# Patient Record
Sex: Female | Born: 1990 | Race: White | Hispanic: No | Marital: Married | Smoking: Never smoker
Health system: Southern US, Community
[De-identification: ages and names within clinical notes are randomized; demographics above are authoritative.]

## PROBLEM LIST (undated history)

## (undated) ENCOUNTER — Inpatient Hospital Stay (HOSPITAL_COMMUNITY): Payer: Self-pay

---
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
## 2011-02-04 IMAGING — US US OB COMP +14 WK
1 series · 18 of 28 positions shown · non-contrast
Comparison: none

OBSTETRICAL ULTRASOUND:
 This ultrasound was performed in The [HOSPITAL], and the [facility] report will be stored to [REDACTED] PACS.

[Series 1: us ob comp +14 wk · 18 of 82 slices shown]
[im 1/82]
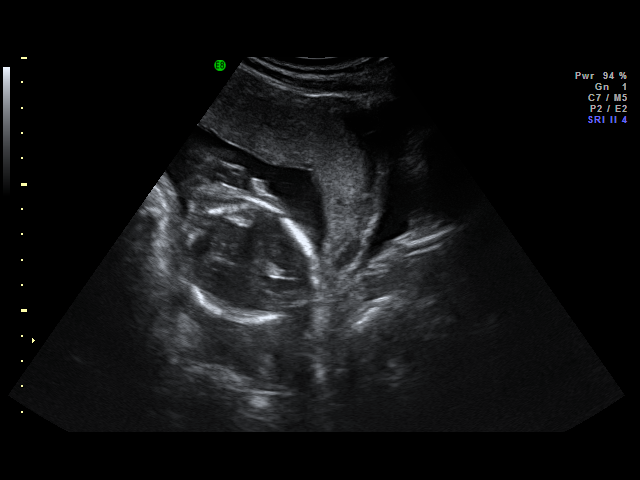
[im 7/82]
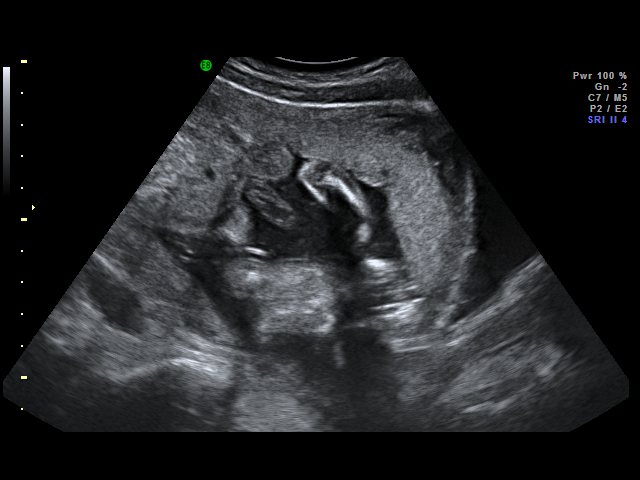
[im 10/82]
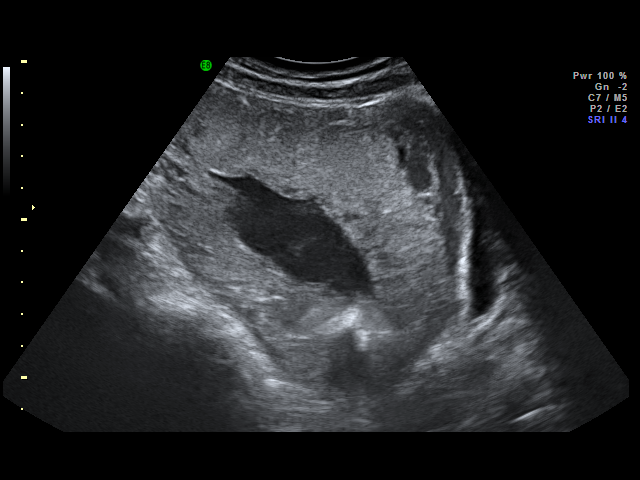
[im 16/82]
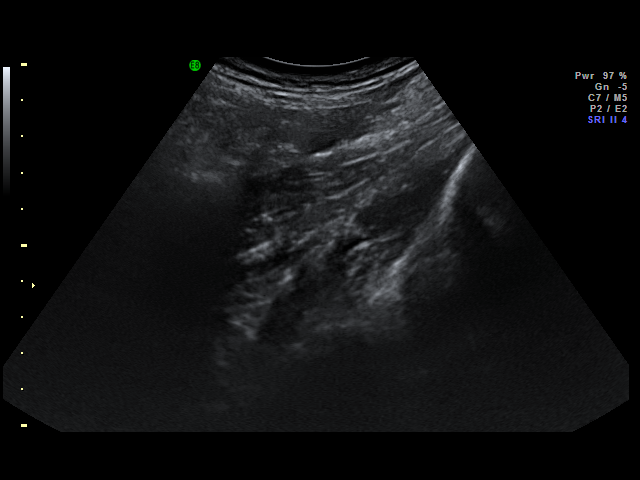
[im 22/82]
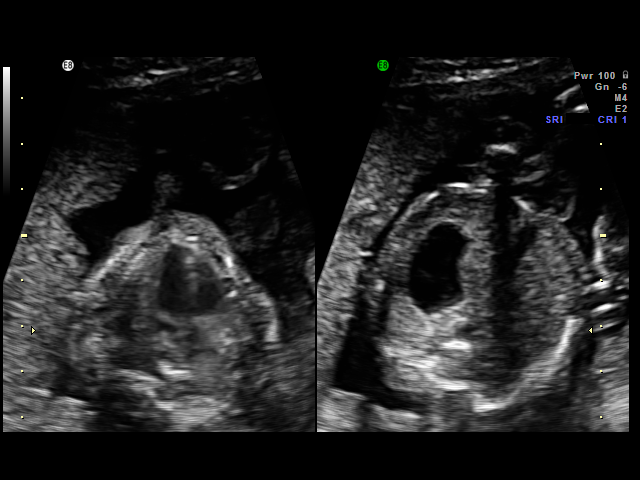
[im 25/82]
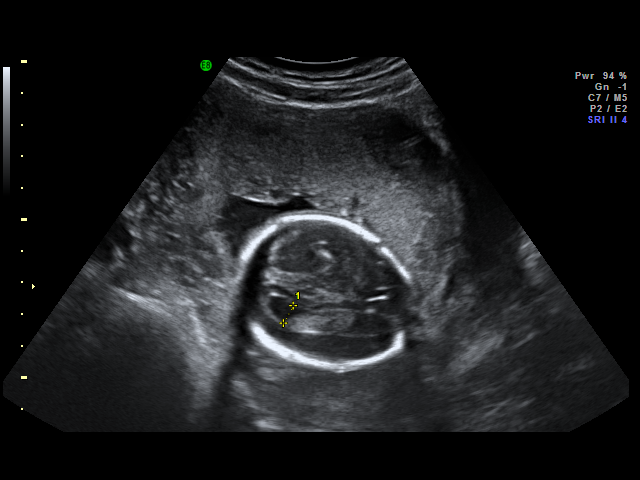
[im 31/82]
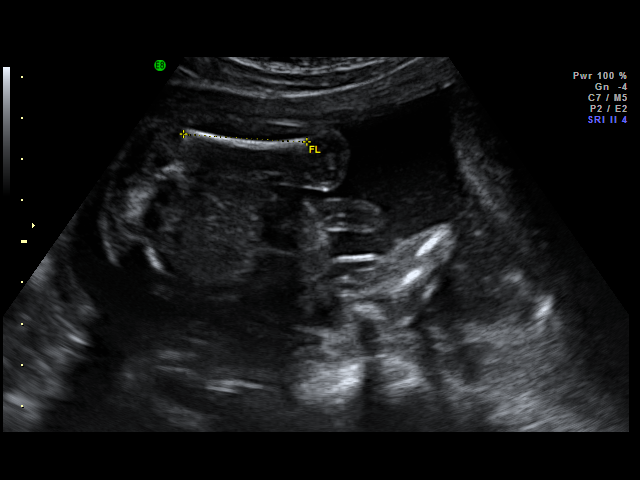
[im 34/82]
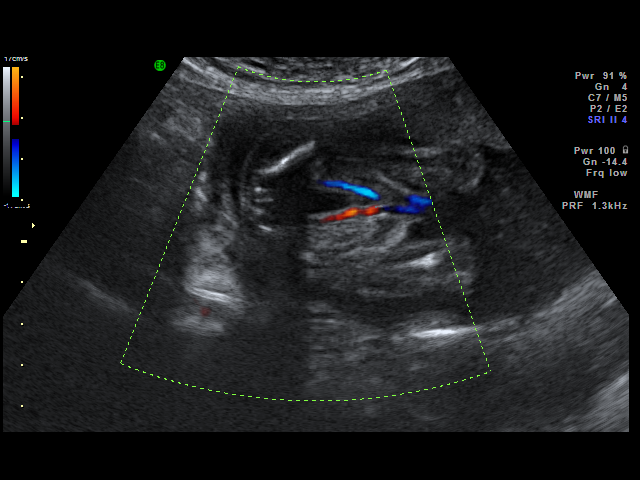
[im 40/82]
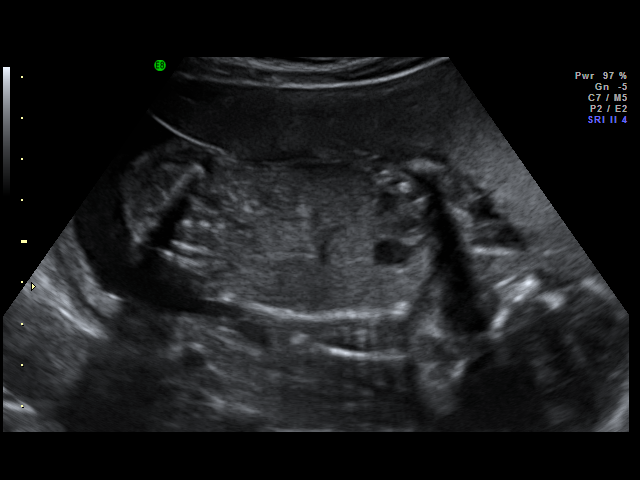
[im 43/82]
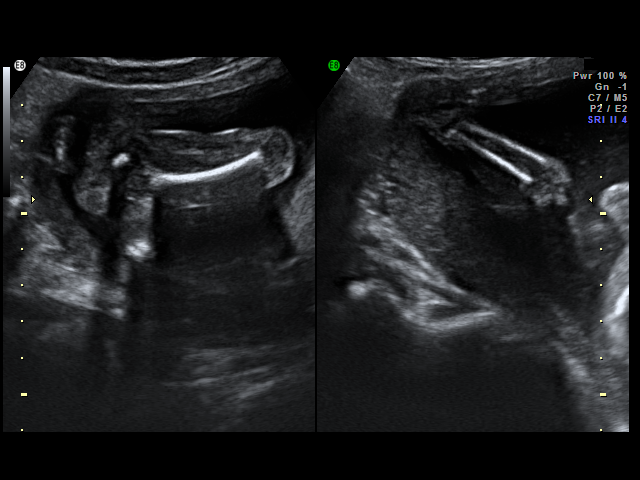
[im 49/82]
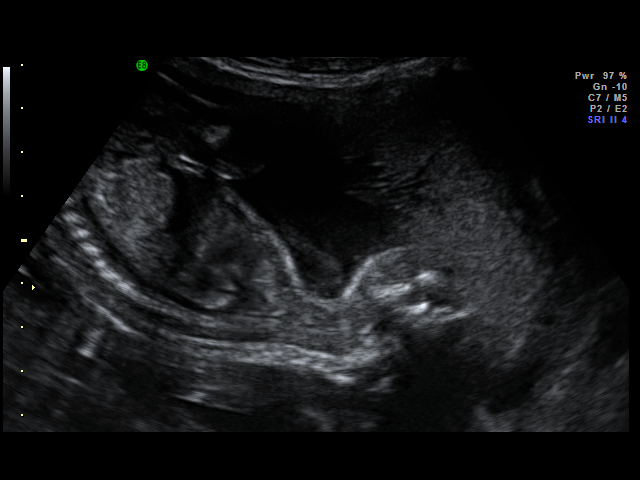
[im 52/82]
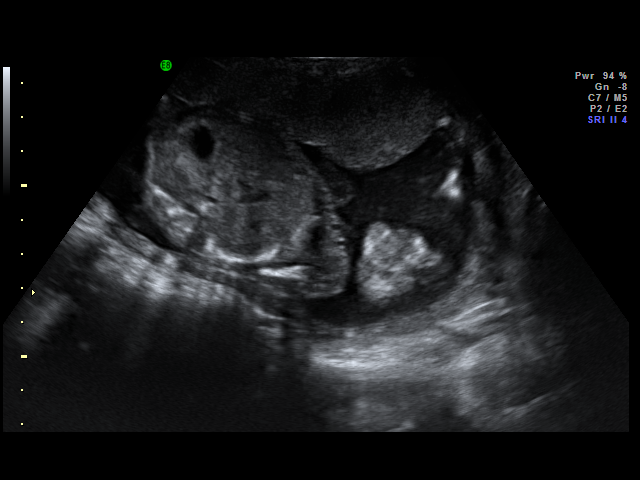
[im 58/82]
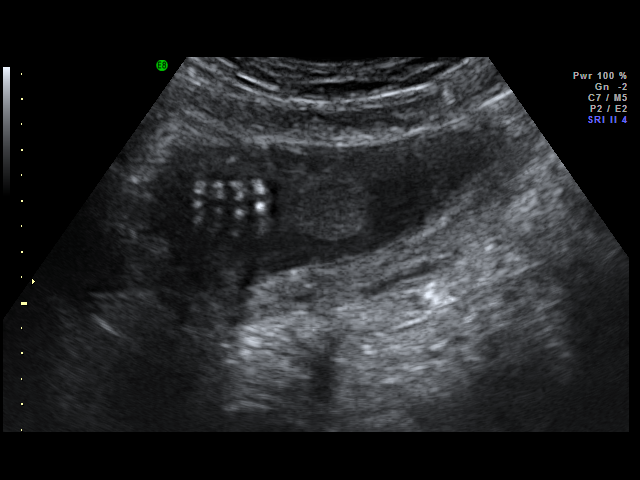
[im 64/82]
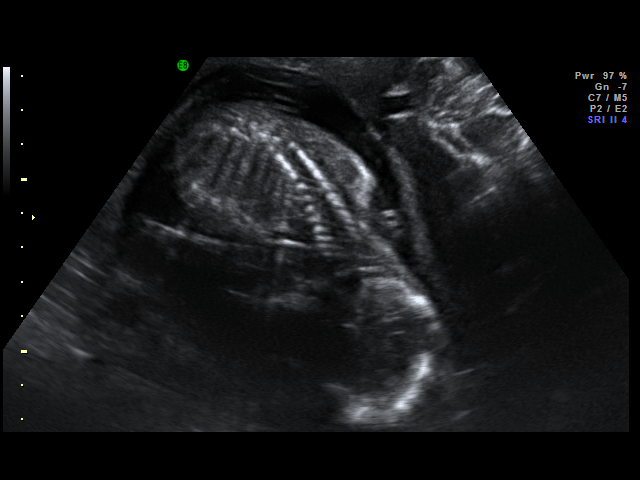
[im 67/82]
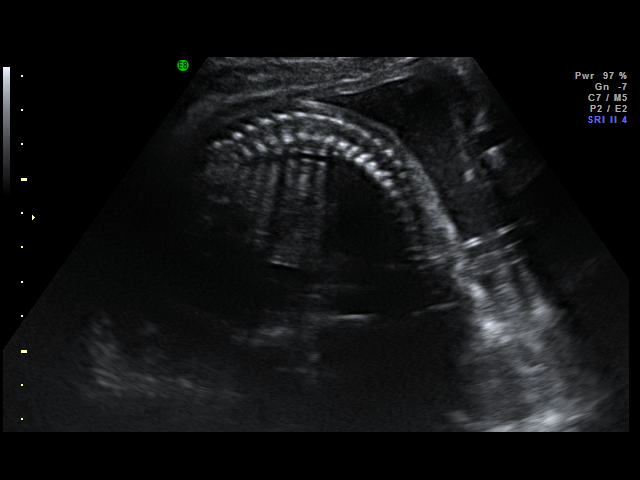
[im 73/82]
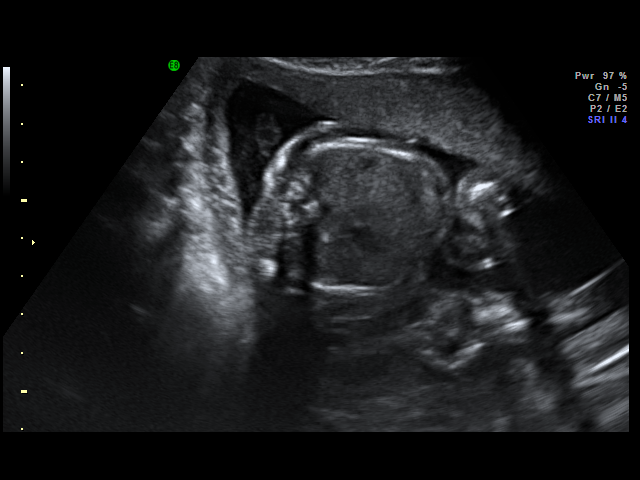
[im 76/82]
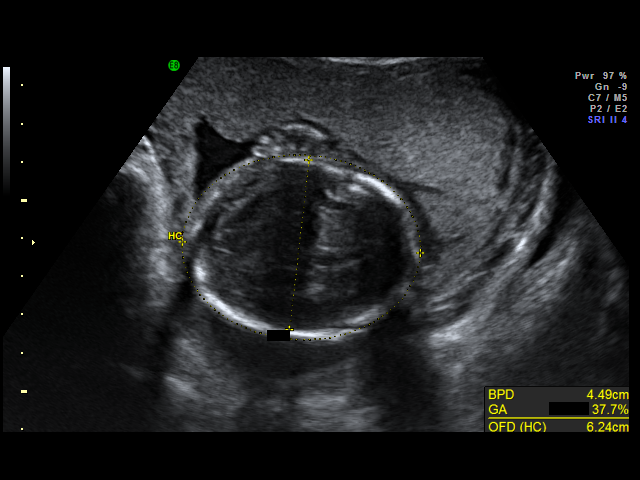
[im 82/82]
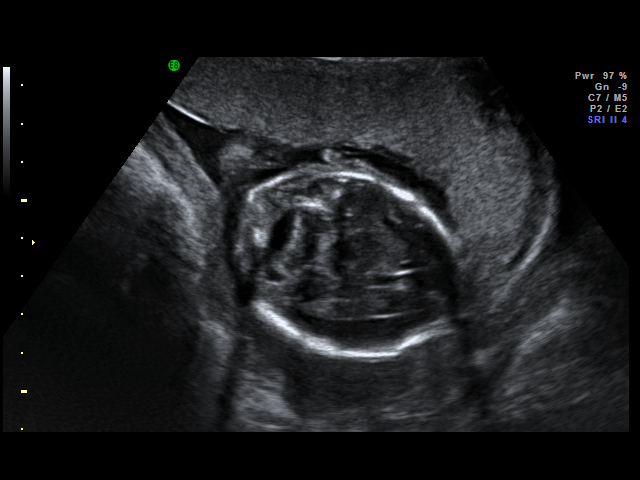

[18 of 28 positions shown; findings below may reference images not displayed]

IMPRESSION: [facility] has also been faxed to the ordering physician.

## 2012-01-14 IMAGING — CT CT HEAD W/O CM
1 of 2 series · 16 of 30 positions shown, 20 images · non-contrast
Comparison: None.

CLINICAL DATA: Syncopal episode striking head on corner of wall.

CT HEAD WITHOUT CONTRAST
TECHNIQUE: Contiguous axial images were obtained from the base of
the skull through the vertex without intravenous contrast.

[Series 3: recon 2: brain · axial · 0.47mm/px · z∈[+135,+262]mm · 16 of 56 slices shown, 20 images]
[im 3/56  brain]
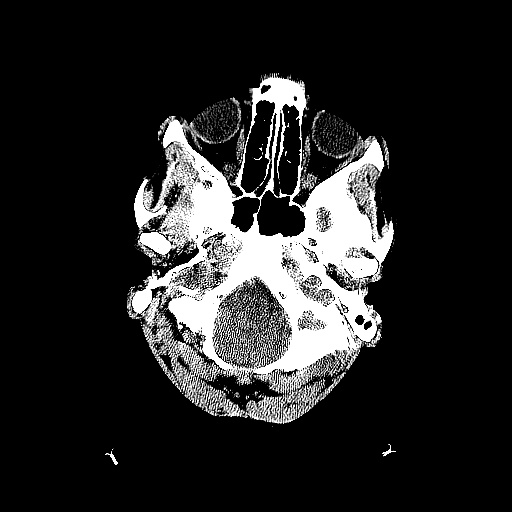
[im 3/56  bone]
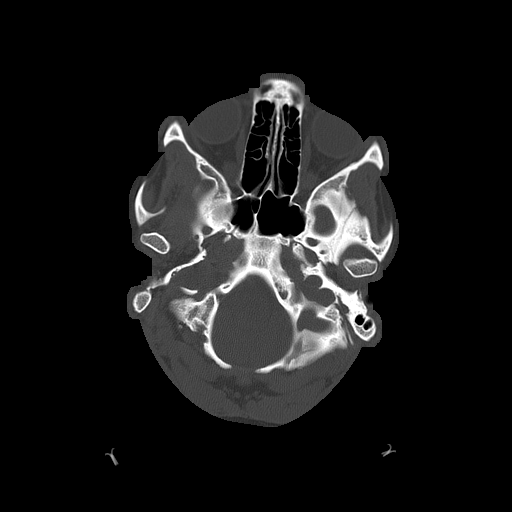
[im 6/56  brain]
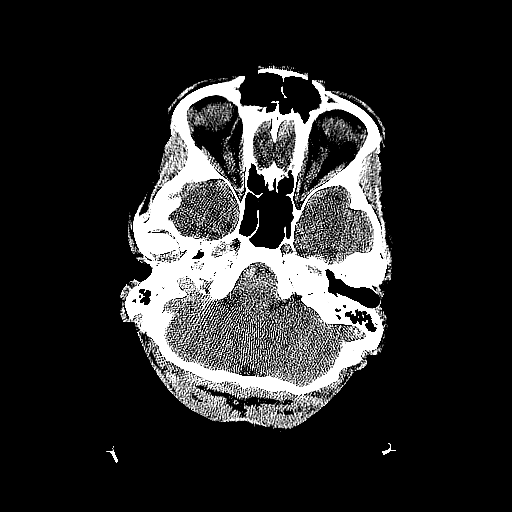
[im 9/56  brain]
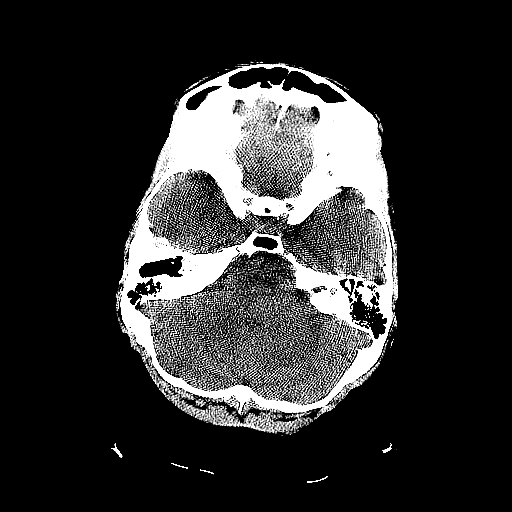
[im 12/56  brain]
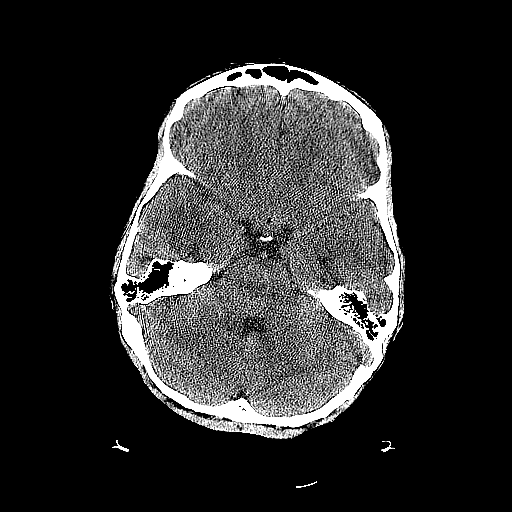
[im 18/56  brain]
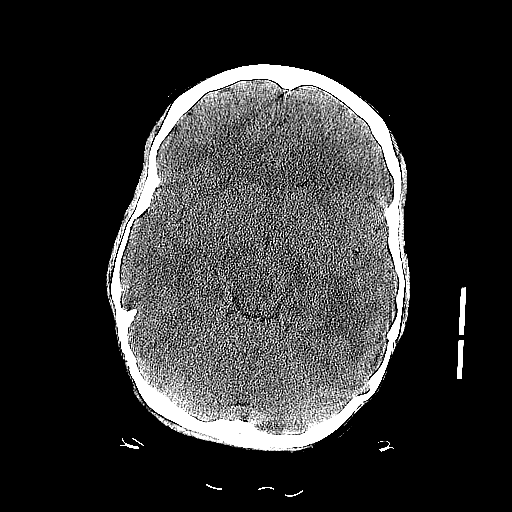
[im 18/56  bone]
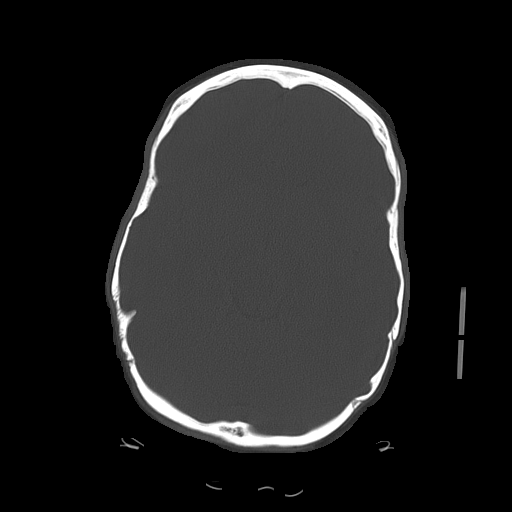
[im 21/56  brain]
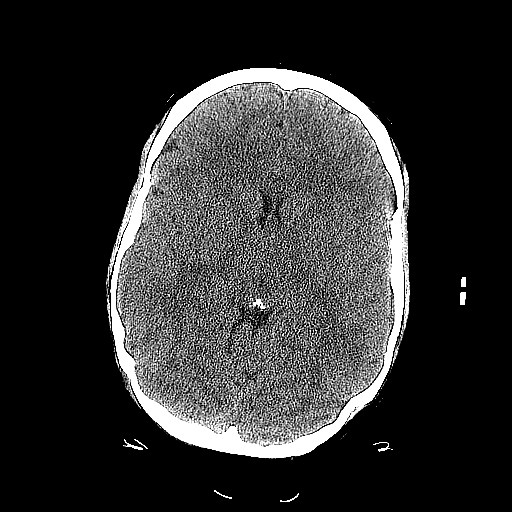
[im 24/56  brain]
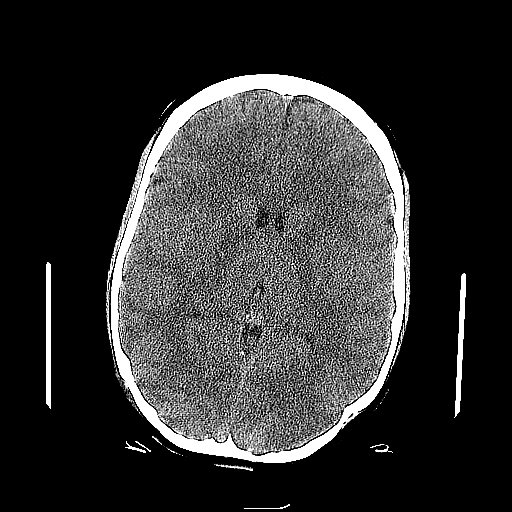
[im 27/56  brain]
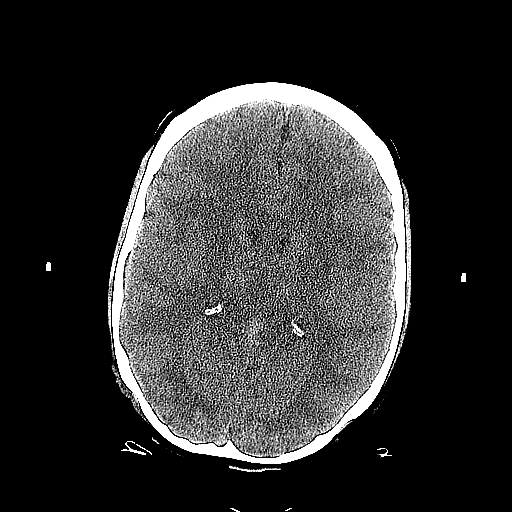
[im 29/56  brain]
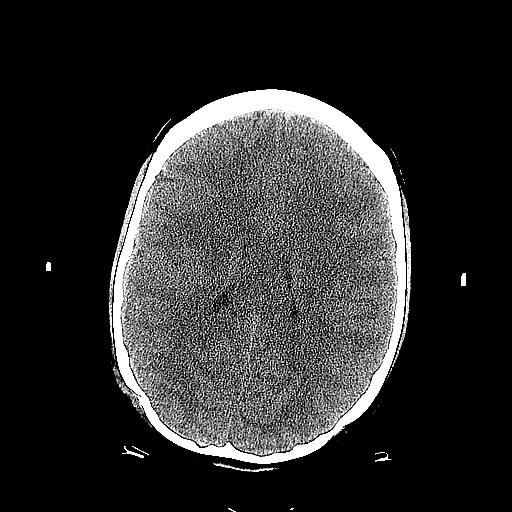
[im 29/56  bone]
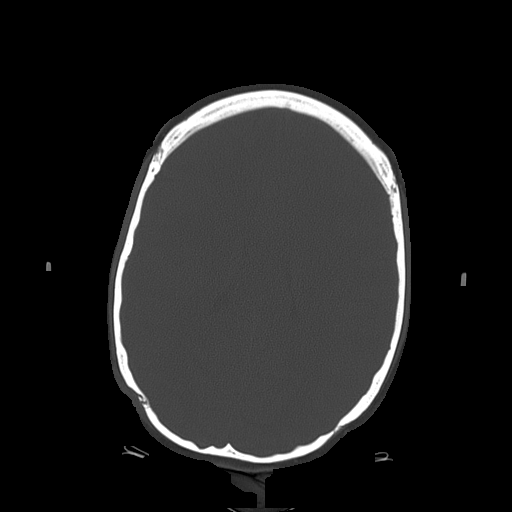
[im 32/56  brain]
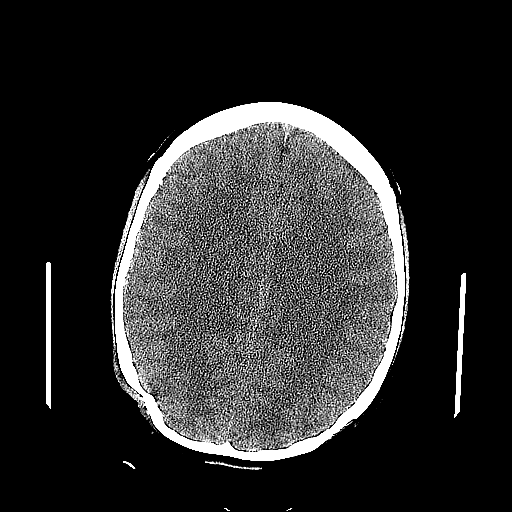
[im 35/56  brain]
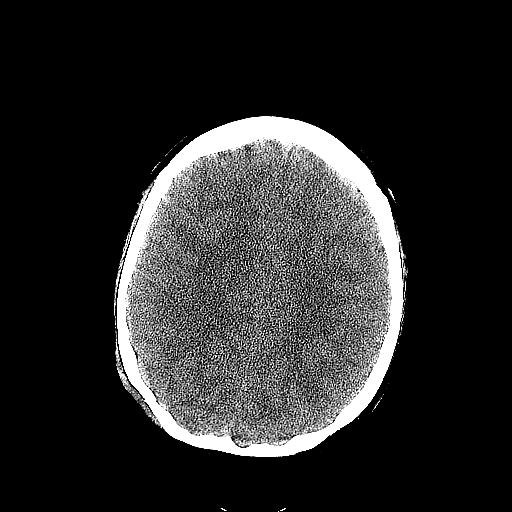
[im 38/56  brain]
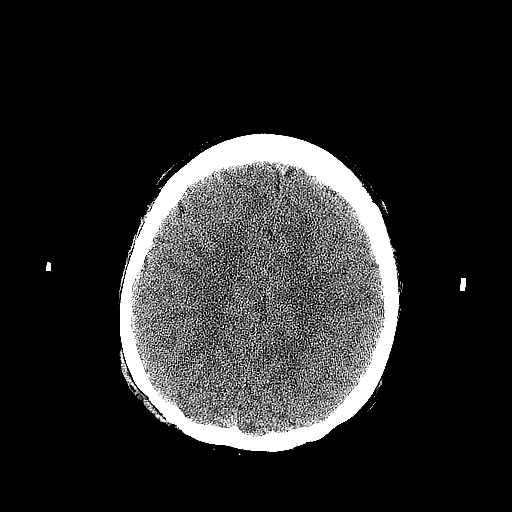
[im 44/56  brain]
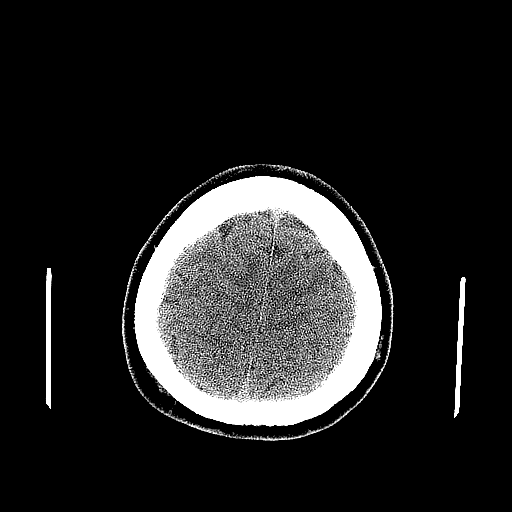
[im 44/56  bone]
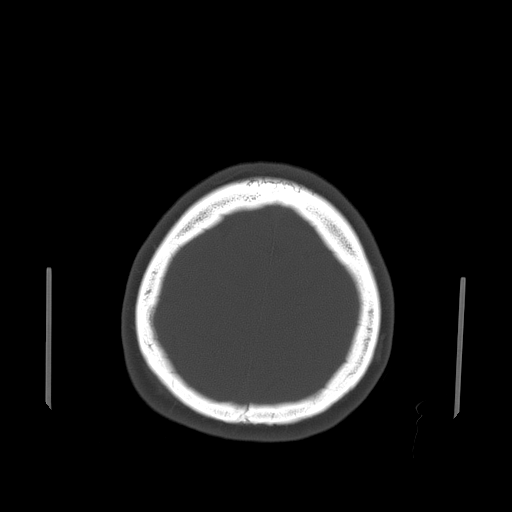
[im 47/56  brain]
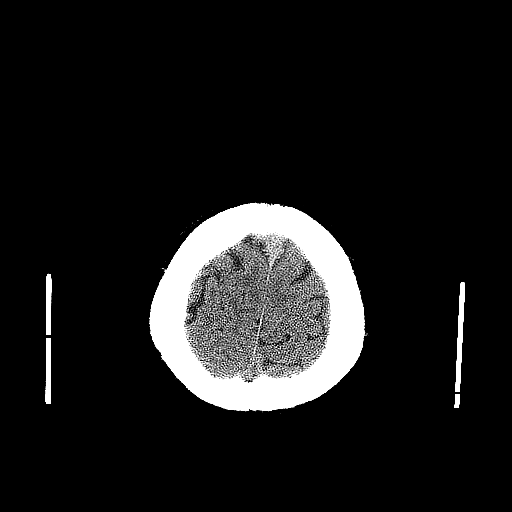
[im 50/56  brain]
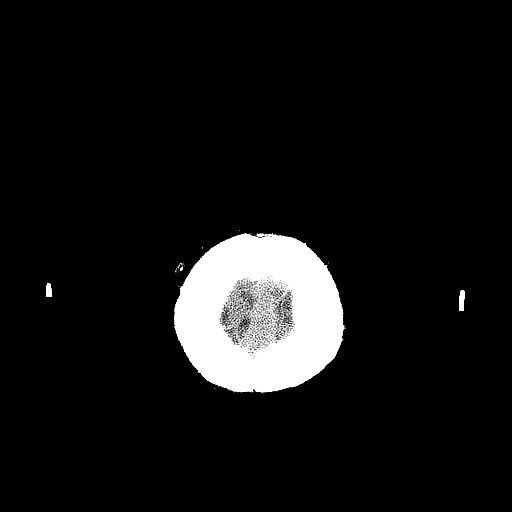
[im 53/56  brain]
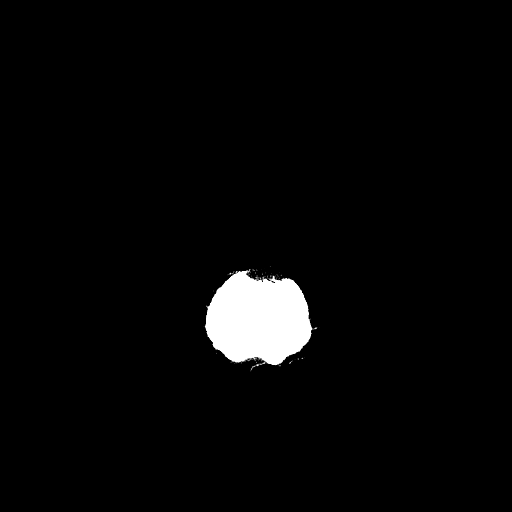

[16 of 30 positions shown; findings below may reference images not displayed]

FINDINGS: There is no evidence for acute infarction, intracranial
hemorrhage, mass lesion, hydrocephalus, or extra-axial fluid. There
is no atrophy or white matter disease.  The calvarium is intact.
There is a small right parieto-occipital scalp hematoma.  The
cerebellar tonsils are incompletely evaluated but present at the
level of the foramen magnum.
IMPRESSION: Small right parieto-occipital scalp hematoma.  No skull fracture or
intracranial hemorrhage.

## 2012-07-08 ENCOUNTER — Encounter (HOSPITAL_COMMUNITY): Payer: Self-pay | Admitting: *Deleted

## 2012-07-08 ENCOUNTER — Inpatient Hospital Stay (HOSPITAL_COMMUNITY)
Admission: AD | Admit: 2012-07-08 | Discharge: 2012-07-08 | Disposition: A | Discharge status: Home / Self Care | Payer: 59 | Source: Ambulatory Visit | Attending: Obstetrics and Gynecology | Admitting: Obstetrics and Gynecology

## 2012-07-08 DIAGNOSIS — N390 Urinary tract infection, site not specified: Secondary | ICD-10-CM | POA: Insufficient documentation

## 2012-07-08 DIAGNOSIS — R350 Frequency of micturition: Secondary | ICD-10-CM | POA: Insufficient documentation

## 2012-07-08 DIAGNOSIS — T8389XA Other specified complication of genitourinary prosthetic devices, implants and grafts, initial encounter: Secondary | ICD-10-CM | POA: Insufficient documentation

## 2012-07-08 LAB — URINALYSIS, ROUTINE W REFLEX MICROSCOPIC
Glucose, UA: NEGATIVE mg/dL
Ketones, ur: NEGATIVE mg/dL
Protein, ur: >300 mg/dL — AB
Urobilinogen, UA: 0.2 mg/dL (ref 0.0–1.0)

## 2012-07-08 LAB — URINE MICROSCOPIC-ADD ON

## 2012-07-08 MED ORDER — CIPROFLOXACIN HCL 500 MG PO TABS: 500.0000 mg | ORAL_TABLET | Freq: Two times a day (BID) | ORAL | Status: DC

## 2012-07-08 MED ORDER — PHENAZOPYRIDINE HCL 200 MG PO TABS: 200.0000 mg | ORAL_TABLET | Freq: Three times a day (TID) | ORAL | Status: DC | PRN

## 2012-07-08 MED ORDER — CIPROFLOXACIN HCL 500 MG PO TABS
500.0000 mg | ORAL_TABLET | Freq: Once | ORAL | Status: AC
  Administered 2012-07-08: 500 mg via ORAL
  Filled 2012-07-08: qty 1

## 2012-07-08 MED ORDER — PHENAZOPYRIDINE HCL 100 MG PO TABS
200.0000 mg | ORAL_TABLET | Freq: Once | ORAL | Status: AC
  Administered 2012-07-08: 200 mg via ORAL
  Filled 2012-07-08: qty 2

## 2012-07-08 NOTE — MAU Note (Signed)
Pt reports urinary frequency and urgency tonight.  Concerned that she is having problems with IUD.

## 2012-07-08 NOTE — MAU Provider Note (Signed)
Chief Complaint: Urinary Frequency  First Provider Initiated Contact with Patient 07/08/12 0200     SUBJECTIVE HPI: Mackenzie Walters is a 22 y.o. G28P2002 female who presents with urinary frequency, urgency and leakage since this evening tonight and concerned that she may have problems with her IUD.    Past Medical History  Diagnosis Date  . Asthma     as child   OB History   Grav Para Term Preterm Abortions TAB SAB Ect Mult Living   2 2 2       2      # Outc Date GA Lbr Len/2nd Wgt Sex Del Anes PTL Lv   1 TRM 2010 [redacted]w[redacted]d  3.6kg(7lb15oz) M SVD EPI  Yes   2 TRM 8/12 [redacted]w[redacted]d 00:00 / 00:47 3.39kg(7lb7.6oz) F SVD EPI  Yes   Comments: none     Past Surgical History  Procedure Laterality Date  . Mouth surgery  2009   History   Social History  . Marital Status: Single    Spouse Name: N/A    Number of Children: N/A  . Years of Education: N/A   Occupational History  . Not on file.   Social History Main Topics  . Smoking status: Never Smoker   . Smokeless tobacco: Not on file  . Alcohol Use: No  . Drug Use: No  . Sexually Active: Yes    Birth Control/ Protection: IUD     Comment: desires tubal   Other Topics Concern  . Not on file   Social History Narrative  . No narrative on file   No current facility-administered medications on file prior to encounter.   Current Outpatient Prescriptions on File Prior to Encounter  Medication Sig Dispense Refill  . prenatal vitamin w/FE, FA (PRENATAL 1 + 1) 27-1 MG TABS Take 1 tablet by mouth daily.         Allergies  Allergen Reactions  . Penicillins Other (See Comments)    Happened in childhood,reaction unknown    ROS: Positive for urinary frequency, urgency, leaking. Negative for fever, chills cough abdominal pain, dyspareunia, vaginal bleeding, vaginal discharge.  OBJECTIVE Blood pressure 98/54, pulse 91, temperature 97.5 F (36.4 C), temperature source Oral, resp. rate 16, height 5\' 6"  (1.676 m), weight 59.603 kg (131 lb 6.4  oz). GENERAL: Well-developed, well-nourished female in no acute distress.  HEENT: Normocephalic HEART: normal rate RESP: normal effort ABDOMEN: Soft, mild suprapubic tenderness. BACK: No CVA tenderness. EXTREMITIES: Nontender, no edema NEURO: Alert and oriented SPECULUM EXAM: Refused  LAB RESULTS Results for orders placed during the hospital encounter of 07/08/12 (from the past 24 hour(s))  URINALYSIS, ROUTINE W REFLEX MICROSCOPIC     Status: Abnormal   Collection Time    07/08/12 12:56 AM      Result Value Range   Color, Urine YELLOW  YELLOW   APPearance CLOUDY (*) CLEAR   Specific Gravity, Urine 1.025  1.005 - 1.030   pH 7.5  5.0 - 8.0   Glucose, UA NEGATIVE  NEGATIVE mg/dL   Hgb urine dipstick LARGE (*) NEGATIVE   Bilirubin Urine NEGATIVE  NEGATIVE   Ketones, ur NEGATIVE  NEGATIVE mg/dL   Protein, ur >045 (*) NEGATIVE mg/dL   Urobilinogen, UA 0.2  0.0 - 1.0 mg/dL   Nitrite NEGATIVE  NEGATIVE   Leukocytes, UA MODERATE (*) NEGATIVE  URINE MICROSCOPIC-ADD ON     Status: Abnormal   Collection Time    07/08/12 12:56 AM      Result Value Range  Squamous Epithelial / LPF FEW (*) RARE   WBC, UA TOO NUMEROUS TO COUNT  <3 WBC/hpf   RBC / HPF TOO NUMEROUS TO COUNT  <3 RBC/hpf   Bacteria, UA MANY (*) RARE  POCT PREGNANCY, URINE     Status: None   Collection Time    07/08/12  1:07 AM      Result Value Range   Preg Test, Ur NEGATIVE  NEGATIVE    IMAGING No results found.  MAU COURSE  ASSESSMENT 1. UTI (lower urinary tract infection)   Low suspicion for IUD complication based on patient's subjective exam, but full assessment limited by refusal of pelvic exam. Dr. Arelia Sneddon notified. No new orders.  PLAN Discharge home in stable condition. Urine culture pending. Increase fluids. Discussed signs of IUD complication including worsening cramping after treatment for UTI, fever greater than 100.4, vaginal bleeding accompanied by cramping or pain with intercourse.     Follow-up  Information   Follow up with [facility] [place], [place].. (As needed if no improvement in 2-3 days.)    Contact information:   [address] [place] [address] [phone]       Medication List    TAKE these medications       ciprofloxacin 500 MG tablet  Commonly known as:  CIPRO  Take 1 tablet (500 mg total) by mouth 2 (two) times daily.     phenazopyridine 200 MG tablet  Commonly known as:  PYRIDIUM  Take 1 tablet (200 mg total) by mouth 3 (three) times daily as needed for pain.     prenatal vitamin w/FE, FA 27-1 MG Tabs  Take 1 tablet by mouth daily.       [place], CNM 07/08/2012  2:48 AM

## 2012-07-09 LAB — URINE CULTURE: Colony Count: 70000

## 2013-12-07 ENCOUNTER — Encounter (HOSPITAL_COMMUNITY): Payer: Self-pay | Admitting: *Deleted

## 2013-12-31 ENCOUNTER — Encounter (HOSPITAL_COMMUNITY): Payer: Self-pay

## 2013-12-31 ENCOUNTER — Inpatient Hospital Stay (HOSPITAL_COMMUNITY)
Admission: AD | Admit: 2013-12-31 | Discharge: 2013-12-31 | Disposition: A | Discharge status: Home / Self Care | Source: Ambulatory Visit | Attending: Obstetrics and Gynecology | Admitting: Obstetrics and Gynecology

## 2013-12-31 DIAGNOSIS — Z3A22 22 weeks gestation of pregnancy: Secondary | ICD-10-CM | POA: Insufficient documentation

## 2013-12-31 DIAGNOSIS — O9989 Other specified diseases and conditions complicating pregnancy, childbirth and the puerperium: Secondary | ICD-10-CM | POA: Diagnosis present

## 2013-12-31 DIAGNOSIS — R05 Cough: Secondary | ICD-10-CM | POA: Diagnosis not present

## 2013-12-31 NOTE — Discharge Instructions (Signed)

## 2013-12-31 NOTE — MAU Note (Signed)
Has had a cough x 1 week; states leaks clear fluid every time she coughs. Denies vaginal bleeding. Positive fetal movement.

## 2014-11-05 ENCOUNTER — Encounter (HOSPITAL_COMMUNITY): Payer: Self-pay | Admitting: *Deleted

## 2016-11-24 ENCOUNTER — Inpatient Hospital Stay (HOSPITAL_COMMUNITY)
Admission: AD | Admit: 2016-11-24 | Discharge: 2016-11-24 | Disposition: A | Discharge status: Home / Self Care | Payer: Medicaid Other | Source: Ambulatory Visit | Attending: Obstetrics and Gynecology | Admitting: Obstetrics and Gynecology

## 2016-11-24 ENCOUNTER — Encounter (HOSPITAL_COMMUNITY): Payer: Self-pay | Admitting: *Deleted

## 2016-11-24 DIAGNOSIS — O479 False labor, unspecified: Secondary | ICD-10-CM

## 2016-11-24 DIAGNOSIS — O368130 Decreased fetal movements, third trimester, not applicable or unspecified: Secondary | ICD-10-CM | POA: Diagnosis not present

## 2016-11-24 DIAGNOSIS — O471 False labor at or after 37 completed weeks of gestation: Secondary | ICD-10-CM | POA: Diagnosis not present

## 2016-11-24 DIAGNOSIS — R110 Nausea: Secondary | ICD-10-CM | POA: Diagnosis not present

## 2016-11-24 DIAGNOSIS — Z3A38 38 weeks gestation of pregnancy: Secondary | ICD-10-CM

## 2016-11-24 DIAGNOSIS — O26899 Other specified pregnancy related conditions, unspecified trimester: Secondary | ICD-10-CM

## 2016-11-24 MED ORDER — ONDANSETRON HCL 8 MG PO TABS: 8.0000 mg | ORAL_TABLET | Freq: Three times a day (TID) | ORAL | 0 refills | Status: DC | PRN

## 2016-11-24 MED ORDER — ONDANSETRON HCL 4 MG PO TABS
8.0000 mg | ORAL_TABLET | Freq: Once | ORAL | Status: AC
  Administered 2016-11-24: 8 mg via ORAL
  Filled 2016-11-24: qty 2

## 2016-11-24 MED ORDER — CALCIUM CARBONATE ANTACID 500 MG PO CHEW
2.0000 | CHEWABLE_TABLET | Freq: Once | ORAL | Status: AC
  Administered 2016-11-24: 400 mg via ORAL
  Filled 2016-11-24: qty 2

## 2016-11-24 NOTE — MAU Note (Signed)
Cramping and nausea since this am. Gradually getting worse. Denies LOF or bleeding

## 2016-11-24 NOTE — MAU Note (Signed)
Urine sent to lab 

## 2016-11-24 NOTE — MAU Provider Note (Signed)
Chief Complaint  Patient presents with  . Nausea  . Abdominal Cramping     First Provider Initiated Contact with Patient 11/24/16 2306      S: Mackenzie Walters  is a 26 y.o. y.o. year old G47P2003 female at [redacted]w[redacted]d weeks gestation who presents to MAU reporting cramping, nausea, heartburn and decreased fetal movement. Gets prenatal care in [place]. Is in [place] visiting family.   Contractions: Irreg, mild Vaginal bleeding: denies Leaking of fluid: denies Reports loosing mucus plug.   Patient Active Problem List   Diagnosis Date Noted  . NEOPLASM, SKIN, UNCERTAIN BEHAVIOR 59/16/3846    O:  Patient Vitals for the past 24 hrs:  BP Temp Pulse Resp Height Weight  11/24/16 2210 134/75 - (!) 109 - - -  11/24/16 2154 133/83 98.4 F (36.9 C) (!) 110 18 5\' 7"  (1.702 m) 183 lb (83 kg)   General: NAD Heart: Regular rate Lungs: Normal rate and effort Abd: Soft, NT, Gravid, S=D Pelvic: NEFG, no blood.  Dilation: 2.5 Effacement (%): 60 Station: -3 Exam by:: Glenice Bow, rnc   NST performed EFM: 130, reactive Toco: UI  Decreased fetal movement resolved with reactive NST. Contractions C/W BH, not active labor.   A: [redacted]w[redacted]d week IUP 1. Decreased fetal movements in third trimester, single or unspecified fetus   2. False labor   3. Pregnancy related nausea, antepartum      P: Discharge home in stable condition. Labor precautions and fetal kick counts. Rx Zofran. Follow-up as scheduled for prenatal visit or sooner as needed if symptoms worsen. Return to maternity admissions as needed if symptoms worsen.  Tamala Julian, [place], [place] 11/24/2016 11:31 PM  2

## 2016-11-24 NOTE — Discharge Instructions (Signed)
Braxton Hicks Contractions °Contractions of the uterus can occur throughout pregnancy, but they are not always a sign that you are in labor. You may have practice contractions called Braxton Hicks contractions. These false labor contractions are sometimes confused with true labor. °What are Braxton Hicks contractions? °Braxton Hicks contractions are tightening movements that occur in the muscles of the uterus before labor. Unlike true labor contractions, these contractions do not result in opening (dilation) and thinning of the cervix. Toward the end of pregnancy (32-34 weeks), Braxton Hicks contractions can happen more often and may become stronger. These contractions are sometimes difficult to tell apart from true labor because they can be very uncomfortable. You should not feel embarrassed if you go to the hospital with false labor. °Sometimes, the only way to tell if you are in true labor is for your health care provider to look for changes in the cervix. The health care provider will do a physical exam and may monitor your contractions. If you are not in true labor, the exam should show that your cervix is not dilating and your water has not broken. °If there are no prenatal problems or other health problems associated with your pregnancy, it is completely safe for you to be sent home with false labor. You may continue to have Braxton Hicks contractions until you go into true labor. °How can I tell the difference between true labor and false labor? °· Differences °? False labor °? Contractions last 30-70 seconds.: Contractions are usually shorter and not as strong as true labor contractions. °? Contractions become very regular.: Contractions are usually irregular. °? Discomfort is usually felt in the top of the uterus, and it spreads to the lower abdomen and low back.: Contractions are often felt in the front of the lower abdomen and in the groin. °? Contractions do not go away with walking.: Contractions may  go away when you walk around or change positions while lying down. °? Contractions usually become more intense and increase in frequency.: Contractions get weaker and are shorter-lasting as time goes on. °? The cervix dilates and gets thinner.: The cervix usually does not dilate or become thin. °Follow these instructions at home: °· Take over-the-counter and prescription medicines only as told by your health care provider. °· Keep up with your usual exercises and follow other instructions from your health care provider. °· Eat and drink lightly if you think you are going into labor. °· If Braxton Hicks contractions are making you uncomfortable: °? Change your position from lying down or resting to walking, or change from walking to resting. °? Sit and rest in a tub of warm water. °? Drink enough fluid to keep your urine clear or pale yellow. Dehydration may cause these contractions. °? Do slow and deep breathing several times an hour. °· Keep all follow-up prenatal visits as told by your health care provider. This is important. °Contact a health care provider if: °· You have a fever. °· You have continuous pain in your abdomen. °Get help right away if: °· Your contractions become stronger, more regular, and closer together. °· You have fluid leaking or gushing from your vagina. °· You pass blood-tinged mucus (bloody show). °· You have bleeding from your vagina. °· You have low back pain that you never had before. °· You feel your baby’s head pushing down and causing pelvic pressure. °· Your baby is not moving inside you as much as it used to. °Summary °· Contractions that occur before labor are   called Braxton Hicks contractions, false labor, or practice contractions.  Braxton Hicks contractions are usually shorter, weaker, farther apart, and less regular than true labor contractions. True labor contractions usually become progressively stronger and regular and they become more frequent.  Manage discomfort from  [facility] contractions by changing position, resting in a warm bath, drinking plenty of water, or practicing deep breathing. This information is not intended to replace advice given to you by your health care provider. Make sure you discuss any questions you have with your health care provider. Document Released: 01/22/2005 Document Revised: 12/12/2015 Document Reviewed: 12/12/2015 Elsevier Interactive Patient Education  2017 [place].   Fetal Movement Counts Patient Name: ________________________________________________ Patient Due Date: ____________________ What is a fetal movement count? A fetal movement count is the number of times that you feel your baby move during a certain amount of time. This may also be called a fetal kick count. A fetal movement count is recommended for every pregnant woman. You may be asked to start counting fetal movements as early as week 28 of your pregnancy. Pay attention to when your baby is most active. You may notice your baby's sleep and wake cycles. You may also notice things that make your baby move more. You should do a fetal movement count:  When your baby is normally most active.  At the same time each day.  A good time to count movements is while you are resting, after having something to eat and drink. How do I count fetal movements? 1. Find a quiet, comfortable area. Sit, or lie down on your side. 2. Write down the date, the start time and stop time, and the number of movements that you felt between those two times. Take this information with you to your health care visits. 3. For 2 hours, count kicks, flutters, swishes, rolls, and jabs. You should feel at least 10 movements during 2 hours. 4. You may stop counting after you have felt 10 movements. 5. If you do not feel 10 movements in 2 hours, have something to eat and drink. Then, keep resting and counting for 1 hour. If you feel at least 4 movements during that hour, you may stop  counting. Contact a health care provider if:  You feel fewer than 4 movements in 2 hours.  Your baby is not moving like he or she usually does. Date: ____________ Start time: ____________ Stop time: ____________ Movements: ____________ Date: ____________ Start time: ____________ Stop time: ____________ Movements: ____________ Date: ____________ Start time: ____________ Stop time: ____________ Movements: ____________ Date: ____________ Start time: ____________ Stop time: ____________ Movements: ____________ Date: ____________ Start time: ____________ Stop time: ____________ Movements: ____________ Date: ____________ Start time: ____________ Stop time: ____________ Movements: ____________ Date: ____________ Start time: ____________ Stop time: ____________ Movements: ____________ Date: ____________ Start time: ____________ Stop time: ____________ Movements: ____________ Date: ____________ Start time: ____________ Stop time: ____________ Movements: ____________ This information is not intended to replace advice given to you by your health care provider. Make sure you discuss any questions you have with your health care provider. Document Released: 02/21/2006 Document Revised: 09/21/2015 Document Reviewed: 03/03/2015 Elsevier Interactive Patient Education  [employer].

## 2016-11-24 NOTE — Progress Notes (Signed)
Pt also c/o of indigestion and "baby not moving as much"

## 2017-02-27 ENCOUNTER — Encounter (HOSPITAL_COMMUNITY): Payer: Self-pay

## 2020-12-18 ENCOUNTER — Other Ambulatory Visit: Payer: Self-pay

## 2020-12-18 ENCOUNTER — Emergency Department
Admission: EM | Admit: 2020-12-18 | Discharge: 2020-12-18 | Disposition: A | Discharge status: Home / Self Care | Payer: Managed Care, Other (non HMO) | Attending: Emergency Medicine | Admitting: Emergency Medicine

## 2020-12-18 DIAGNOSIS — W231XXA Caught, crushed, jammed, or pinched between stationary objects, initial encounter: Secondary | ICD-10-CM | POA: Insufficient documentation

## 2020-12-18 DIAGNOSIS — J45909 Unspecified asthma, uncomplicated: Secondary | ICD-10-CM | POA: Diagnosis not present

## 2020-12-18 DIAGNOSIS — S6991XA Unspecified injury of right wrist, hand and finger(s), initial encounter: Secondary | ICD-10-CM | POA: Insufficient documentation

## 2020-12-18 MED ORDER — CEPHALEXIN 500 MG PO CAPS: 1000.0000 mg | ORAL_CAPSULE | Freq: Two times a day (BID) | ORAL | 0 refills | Status: DC

## 2020-12-18 MED ORDER — LIDOCAINE HCL (PF) 1 % IJ SOLN
10.0000 mL | Freq: Once | INTRAMUSCULAR | Status: AC
  Administered 2020-12-18: 10 mL
  Filled 2020-12-18: qty 10

## 2020-12-18 NOTE — ED Provider Notes (Signed)
[facility] Emergency Department Provider Note  ____________________________________________  Time seen: Approximately 5:58 PM  I have reviewed the triage vital signs and the nursing notes.   HISTORY  Chief Complaint Nail Problem    HPI Mackenzie Walters is a 30 y.o. female who presents the emergency department after sustaining an injury to the right pinky fingernail.  Patient caught it while laying some flooring.  It is loose and cannot at this time.  No active bleeding.  No other injury or complaint.       Past Medical History:  Diagnosis Date   Asthma    as child    Patient Active Problem List   Diagnosis Date Noted   NEOPLASM, SKIN, UNCERTAIN BEHAVIOR 31/49/7026    Past Surgical History:  Procedure Laterality Date   MOUTH SURGERY  2009    Prior to Admission medications   Medication Sig Start Date End Date Taking? Authorizing Provider  cephALEXin (KEFLEX) 500 MG capsule Take 2 capsules (1,000 mg total) by mouth 2 (two) times daily. 12/18/20  Yes Zavier Canela, Charline Bills, PA-C  diphenhydrAMINE (BENADRYL) 25 MG tablet Take 25 mg by mouth every 6 (six) hours as needed.    [provider]  ondansetron (ZOFRAN) 8 MG tablet Take 1 tablet (8 mg total) by mouth every 8 (eight) hours as needed for nausea or vomiting. 11/24/16   Tamala Julian, [place], [place]  prenatal vitamin w/FE, FA (PRENATAL 1 + 1) 27-1 MG TABS Take 1 tablet by mouth daily.      [provider]  Pseudoeph-Bromphen-DM (ROBITUSSIN ALLERGY/COUGH PO) Take by mouth.    [provider]    Allergies Penicillins  Family History  Problem Relation Age of Onset   Cancer Maternal Grandmother    Heart disease Paternal Grandmother    Heart disease Paternal Grandfather     Social History Social History   Tobacco Use   Smoking status: Never   Smokeless tobacco: Never  Substance Use Topics   Alcohol use: No   Drug use: No     Review of Systems  Constitutional: No  fever/chills Eyes: No visual changes. No discharge ENT: No upper respiratory complaints. Cardiovascular: no chest pain. Respiratory: no cough. No SOB. Gastrointestinal: No abdominal pain.  No nausea, no vomiting.  No diarrhea.  No constipation. Musculoskeletal: No injury to the pinky finger right hand Skin: Negative for rash, abrasions, lacerations, ecchymosis. Neurological: Negative for headaches, focal weakness or numbness.  10 System ROS otherwise negative.  ____________________________________________   PHYSICAL EXAM:  VITAL SIGNS: ED Triage Vitals [12/18/20 1606]  Enc Vitals Group     BP 130/73     Pulse Rate 79     Resp 16     Temp 98.5 F (36.9 C)     Temp Source Oral     SpO2 98 %     Weight      Height      Head Circumference      Peak Flow      Pain Score      Pain Loc      Pain Edu?      Excl. in [place]?      Constitutional: Alert and oriented. Well appearing and in no acute distress. Eyes: Conjunctivae are normal. PERRL. EOMI. Head: Atraumatic. ENT:      Ears:       Nose: No congestion/rhinnorhea.      Mouth/Throat: Mucous membranes are moist.  Neck: No stridor.    Cardiovascular: Normal rate, regular  rhythm. Normal S1 and S2.  Good peripheral circulation. Respiratory: Normal respiratory effort without tachypnea or retractions. Lungs CTAB. Good air entry to the bases with no decreased or absent breath sounds. Musculoskeletal: Full range of motion to all extremities. No gross deformities appreciated.  Visualization of the pinky finger right hand reveals mildly displaced fingernail.  This essentially occurs along the medial aspect of the fingernail.  There is no active bleeding.  Proximal nailbed is still intact.  Lateral nailbed is still intact. Neurologic:  Normal speech and language. No gross focal neurologic deficits are appreciated.  Skin:  Skin is warm, dry and intact. No rash noted. Psychiatric: Mood and affect are normal. Speech and behavior are  normal. Patient exhibits appropriate insight and judgement.   ____________________________________________   LABS (all labs ordered are listed, but only abnormal results are displayed)  Labs Reviewed - No data to display ____________________________________________  EKG   ____________________________________________  RADIOLOGY   No results found.  ____________________________________________    PROCEDURES  Procedure(s) performed:    .Nail Removal  Date/Time: 12/18/2020 6:43 PM Performed by: Darletta Moll, PA-C Authorized by: Darletta Moll, PA-C   Consent:    Consent obtained:  Verbal   Consent given by:  Patient   Risks discussed:  Bleeding, infection, pain and permanent nail deformity Universal protocol:    Procedure explained and questions answered to patient or proxy's satisfaction: yes     Immediately prior to procedure, a time out was called: yes     Patient identity confirmed:  Verbally with patient Location:    Hand:  R small finger Pre-procedure details:    Skin preparation:  Chlorhexidine   Preparation: Patient was prepped and draped in the usual sterile fashion   Anesthesia:    Anesthesia method:  Nerve block   Block location:  Pinky finger R hand   Block needle gauge:  27 G   Block anesthetic:  Lidocaine 1% w/o epi   Block technique:  Digital block   Block injection procedure:  Anatomic landmarks identified, introduced needle, negative aspiration for blood and incremental injection   Block outcome:  Anesthesia achieved Nail Removal:    Removed nail replaced and anchored: yes   Nails trimmed:    Number of nails trimmed:  1 Post-procedure details:    Dressing:  Splint   Procedure completion:  Tolerated well, no immediate complications Comments:     Digital block was performed with good success.  Using electrical Bovie, 2 small holes were placed along the medial aspect of the nailbed.  Using 3-0 suture, medial nailbed is tacked  down.  2 sutures placed.  Proximal nailbed remains in place, lateral nailbed remains in place.  This fingernail is trimmed using a 10 blade scalpel to be flush with the end of the finger.    Medications  lidocaine (PF) (XYLOCAINE) 1 % injection 10 mL (has no administration in time range)     ____________________________________________   INITIAL IMPRESSION / ASSESSMENT AND PLAN / ED COURSE  Pertinent labs & imaging results that were available during my care of the patient were reviewed by me and considered in my medical decision making (see chart for details).  Review of the [employer] CSRS was performed in accordance of the [place] prior to dispensing any controlled drugs.           Patient's diagnosis is consistent with fingernail injury.  Patient presented to the emergency department with displacement of the medial nailbed of the pinky finger right  hand.  As the proximal nailbed was still intact, lateral nailbed was still intact patient had procedure as described above to secure nail to the nailbed.  2 sutures are used to secure the medial nailbed to the fingernail.  Wound care instructions given to the patient.  Antibiotics prophylactically.  Follow-up with dermatology if nail does not appear to be healing appropriately for complete removal..  Patient is given ED precautions to return to the ED for any worsening or new symptoms.     ____________________________________________  FINAL CLINICAL IMPRESSION(S) / ED DIAGNOSES  Final diagnoses:  Injury to fingernail of right hand, initial encounter      NEW MEDICATIONS STARTED DURING THIS VISIT:  ED Discharge Orders          Ordered    cephALEXin (KEFLEX) 500 MG capsule  2 times daily        12/18/20 1846                This chart was dictated using voice recognition software/Dragon. Despite best efforts to proofread, errors can occur which can change the meaning. Any change was purely unintentional.    Darletta Moll, PA-C 12/18/20 1847    Vladimir Crofts, MD 12/18/20 1901

## 2020-12-18 NOTE — ED Triage Notes (Signed)
Pt states she nearly ripped her right pinkie nail off laying flooring today

## 2020-12-18 NOTE — ED Notes (Signed)
Patient declined discharge vital signs. 

## 2022-08-23 ENCOUNTER — Ambulatory Visit: Payer: 59 | Admitting: Neurology

## 2022-08-23 ENCOUNTER — Encounter: Payer: Self-pay | Admitting: Neurology

## 2022-08-23 VITALS — BP 126/61 | HR 68 | Ht 67.0 in | Wt 186.0 lb

## 2022-08-23 DIAGNOSIS — R7309 Other abnormal glucose: Secondary | ICD-10-CM

## 2022-08-23 DIAGNOSIS — R5383 Other fatigue: Secondary | ICD-10-CM | POA: Diagnosis not present

## 2022-08-23 DIAGNOSIS — R635 Abnormal weight gain: Secondary | ICD-10-CM

## 2022-08-23 DIAGNOSIS — G43009 Migraine without aura, not intractable, without status migrainosus: Secondary | ICD-10-CM | POA: Diagnosis not present

## 2022-08-23 MED ORDER — RIZATRIPTAN BENZOATE 10 MG PO TBDP: 10.0000 mg | ORAL_TABLET | ORAL | 11 refills | Status: DC | PRN

## 2022-08-23 MED ORDER — ONDANSETRON 4 MG PO TBDP: 4.0000 mg | ORAL_TABLET | Freq: Three times a day (TID) | ORAL | 3 refills | Status: DC | PRN

## 2022-08-23 NOTE — Progress Notes (Unsigned)
[facility]    Provider:  Dr Lucia Gaskins Requesting Provider: Sena Hitch, OD Primary Care Provider:  Pcp, No  CC:  migraines  HPI:  Mackenzie Walters is a 32 y.o. female here as requested by Sena Hitch, OD for migraines. PMHx has NEOPLASM, SKIN, UNCERTAIN BEHAVIOR and Migraine without aura and without status migrainosus, not intractable on their problem list.  I also reviewed Dr. Daisey Must from [facility], she was there in April of this year for eye pain and soreness behind the eyes like a "permanent headache", tenderness type pain when dull sharp stabbing pain when its at its worst.  Onset was about a month ago, pain is constant but can get worse and intense to the point of needing to lie down.  More migraines in the last year, only relief for migraines so far as to vomit, examination showed refractive tests OD 20/20 OS 20/20 with pinhole/aided visual acuity, extraocular movements intact, pupils equally round and reactive, APD negative, OD and OS full to confrontation, slit-lamp exam normal, funduscopic exam normal, optic nerve normal no nerve swelling, distinct margins, assessment was good ocular health findings.  She gets episodes, she will start to get a migraine, she has unilateral pain, pulsating/pounding/throbbing, certain foods will trigger, light will trigger, light sensitivity, nausea, vomiting, blurry vision, unknown family history, started 2 years ago and worsening. They can last up 24 hours, sleeping helps, she has to "sleep it off", vomiting helps, no aura, tylenol helps. 4-5 migraines in a month. < 10 total headache days a month. She has never really been treated for migraines. Not exertional or positional. No other focal neurologic deficits, associated symptoms, inciting events or modifiable factors.   Reviewed notes, labs and imaging from outside physicians, which showed :  From a thorough review of records, medications tried greater than 2 months include  Tylenol, Benadryl, ibuprofen, Zofran, oxycodone, sumatriptan  I reviewed care everywhere, I do not see any notes about migraines or from neurology, no recent lab test there.  No recent labs in epic.    .cbc     Latest Ref Rng & Units 08/23/2022    3:35 PM 10/06/2010    5:15 AM 10/05/2010    7:30 AM  CBC  WBC 3.4 - 10.8 x10E3/uL 8.9  12.6  11.7   Hemoglobin 11.1 - 15.9 g/dL 91.4  8.7  78.2   Hematocrit 34.0 - 46.6 % 37.1  26.8  32.7   Platelets 150 - 450 x10E3/uL 216  143  176       Latest Ref Rng & Units 08/23/2022    3:35 PM 12/08/2009    8:39 AM  CMP  Glucose 70 - 99 mg/dL 94  76   BUN 6 - 20 mg/dL 16  15   Creatinine 9.56 - 1.00 mg/dL 2.13  0.7   Sodium 086 - 144 mmol/L 139  140   Potassium 3.5 - 5.2 mmol/L 4.0  3.9   Chloride 96 - 106 mmol/L 103  107   CO2 20 - 29 mmol/L 21  26   Calcium 8.7 - 10.2 mg/dL 9.1  9.3   Total Protein 6.0 - 8.5 g/dL 7.3  7.3   Total Bilirubin 0.0 - 1.2 mg/dL 0.3  0.5   Alkaline Phos 44 - 121 IU/L 81  81   AST 0 - 40 IU/L 31  34   ALT 0 - 32 IU/L 17  20      CT head 02/26/2010:  Narrative  Clinical Data:  Syncopal episode striking head on corner of wall.   CT HEAD WITHOUT CONTRAST   Technique: Contiguous axial images were obtained from the base of the skull through the vertex without intravenous contrast.   Comparison:  None.   Findings:  There is no evidence for acute infarction, intracranial hemorrhage, mass lesion, hydrocephalus, or extra-axial fluid. There is no atrophy or white matter disease.  The calvarium is intact. There is a small right parieto-occipital scalp hematoma.  The cerebellar tonsils are incompletely evaluated but present at the level of the foramen magnum.   IMPRESSION: Small right parieto-occipital scalp hematoma.  No skull fracture or intracranial hemorrhage.  Provider: Tama High    Review of Systems: Patient complains of symptoms per HPI as well as the following symptoms none. Pertinent negatives and  positives per HPI. All others negative.   Social History   Socioeconomic History   Marital status: Married    Spouse name: Not on file   Number of children: Not on file   Years of education: Not on file   Highest education level: Not on file  Occupational History   Not on file  Tobacco Use   Smoking status: Never   Smokeless tobacco: Never  Substance and Sexual Activity   Alcohol use: No   Drug use: No   Sexual activity: Yes    Comment: desires tubal  Other Topics Concern   Not on file  Social History Narrative   Not on file   Social Determinants of Health   Financial Resource Strain: Not on file  Food Insecurity: Not on file  Transportation Needs: Not on file  Physical Activity: Not on file  Stress: Not on file  Social Connections: Not on file  Intimate Partner Violence: Not on file    Family History  Problem Relation Age of Onset   Cancer Maternal Grandmother    Heart disease Paternal Grandmother    Heart disease Paternal Grandfather     Past Medical History:  Diagnosis Date   Asthma    as child    Patient Active Problem List   Diagnosis Date Noted   Migraine without aura and without status migrainosus, not intractable 08/23/2022   NEOPLASM, SKIN, UNCERTAIN BEHAVIOR 12/08/2009    Past Surgical History:  Procedure Laterality Date   MOUTH SURGERY  2009    Current Outpatient Medications  Medication Sig Dispense Refill   ondansetron (ZOFRAN-ODT) 4 MG disintegrating tablet Take 1-2 tablets (4-8 mg total) by mouth every 8 (eight) hours as needed. 30 tablet 3   rizatriptan (MAXALT-MLT) 10 MG disintegrating tablet Take 1 tablet (10 mg total) by mouth as needed for migraine. May repeat in 2 hours if needed 9 tablet 11   No current facility-administered medications for this visit.    Allergies as of 08/23/2022 - Review Complete 08/23/2022  Allergen Reaction Noted   Penicillins Other (See Comments) 09/08/2009    Vitals: BP 126/61   Pulse 68   Ht 5'  7" (1.702 m)   Wt 186 lb (84.4 kg)   BMI 29.13 kg/m  Last Weight:  Wt Readings from Last 1 Encounters:  08/23/22 186 lb (84.4 kg)   Last Height:   Ht Readings from Last 1 Encounters:  08/23/22 5\' 7"  (1.702 m)     Physical exam: Exam: Gen: NAD, conversant, well nourised,  well groomed                     CV: RRR, no MRG. No Carotid  Bruits. No peripheral edema, warm, nontender Eyes: Conjunctivae clear without exudates or hemorrhage  Neuro: Detailed Neurologic Exam  Speech:    Speech is normal; fluent and spontaneous with normal comprehension.  Cognition:    The patient is oriented to person, place, and time;     recent and remote memory intact;     language fluent;     normal attention, concentration,     fund of knowledge Cranial Nerves:    The pupils are equal, round, and reactive to light. No ONH edema. Extraocular movements are intact. Trigeminal sensation is intact and the muscles of mastication are normal. The face is symmetric. The palate elevates in the midline. Hearing intact. Voice is normal. Shoulder shrug is normal. The tongue has normal motion without fasciculations.   Coordination:    Normal finger to nose and heel to shin.   Gait: nml  Motor Observation:    No asymmetry, no atrophy, and no involuntary movements noted. Tone:    Normal muscle tone.    Posture:    Posture is normal. normal erect    Strength:    Strength is V/V in the upper and lower limbs.      Sensation: intact to LT     Reflex Exam:  DTR's:    Deep tendon reflexes in the upper and lower extremities are normal bilaterally.   Toes:    The toes are downgoing bilaterally.   Clonus:    Clonus is absent.    Assessment/Plan:  Patient with episodic migraines. Never been formally treated before. We discussed migraines, provided much education and literature.   At onset of migraines, Take 1 ODT 10mg  Rizatriptan and 1 ODT 4mg  Ondansetron. Repeat in 2 hours. If this does not work we  can try another triptan or then move to the new medications ubrelvy or nurtec.    Orders Placed This Encounter  Procedures   CBC with Differential/Platelets   Comprehensive metabolic panel   TSH Rfx on Abnormal to Free T4   Hemoglobin A1c   Meds ordered this encounter  Medications   rizatriptan (MAXALT-MLT) 10 MG disintegrating tablet    Sig: Take 1 tablet (10 mg total) by mouth as needed for migraine. May repeat in 2 hours if needed    Dispense:  9 tablet    Refill:  11   ondansetron (ZOFRAN-ODT) 4 MG disintegrating tablet    Sig: Take 1-2 tablets (4-8 mg total) by mouth every 8 (eight) hours as needed.    Dispense:  30 tablet    Refill:  3    Cc: Heisler, Melissa, OD,  Pcp, No  Naomie Dean, MD  [facility] [address] [place], [place] [address]  Phone [phone] Fax [phone]

## 2022-08-23 NOTE — Patient Instructions (Addendum)
At onset of migraines, Take 1 ODT 10mg  Rizatriptan and 1 ODT 4mg  Ondansetron. Repeat in 2 hours. If this does not work we can try another triptan and then move to the new medications ubrelvy or nurtec.    Rizatriptan Disintegrating Tablets What is this medication? RIZATRIPTAN (rye za TRIP tan) treats migraines. It works by blocking pain signals and narrowing blood vessels in the brain. It belongs to a group of medications called triptans. It is not used to prevent migraines. This medicine may be used for other purposes; ask your health care provider or pharmacist if you have questions. COMMON BRAND NAME(S): Maxalt-MLT What should I tell my care team before I take this medication? They need to know if you have any of these conditions: Circulation problems in fingers and toes Diabetes Heart disease High blood pressure High cholesterol History of irregular heartbeat History of stroke Stomach or intestine problems Tobacco use An unusual or allergic reaction to rizatriptan, other medications, foods, dyes, or preservatives Pregnant or trying to get pregnant Breast-feeding How should I use this medication? Take this medication by mouth. Take it as directed on the prescription label. You do not need water to take this medication. Leave the tablet in the sealed pack until you are ready to take it. With dry hands, open the pack and gently remove the tablet. If the tablet breaks or crumbles, throw it away. Use a new tablet. Place the tablet on the tongue and allow it to dissolve. Then, swallow it. Do not cut, crush, or chew this medication. Do not use it more often than directed. Talk to your care team about the use of this medication in children. While it may be prescribed for children as young as 6 years for selected conditions, precautions do apply. Overdosage: If you think you have taken too much of this medicine contact a poison control center or emergency room at once. NOTE: This medicine is  only for you. Do not share this medicine with others. What if I miss a dose? This does not apply. This medication is not for regular use. What may interact with this medication? Do not take this medication with any of the following: Ergot alkaloids, such as dihydroergotamine, ergotamine MAOIs, such as Marplan, Nardil, Parnate Other medications for migraine headache, such as almotriptan, eletriptan, frovatriptan, naratriptan, sumatriptan, zolmitriptan This medication may also interact with the following: Certain medications for depression, anxiety, or other mental health conditions Propranolol This list may not describe all possible interactions. Give your health care provider a list of all the medicines, herbs, non-prescription drugs, or dietary supplements you use. Also tell them if you smoke, drink alcohol, or use illegal drugs. Some items may interact with your medicine. What should I watch for while using this medication? Visit your care team for regular checks on your progress. Tell your care team if your symptoms do not start to get better or if they get worse. This medication may affect your coordination, reaction time, or judgment. Do not drive or operate machinery until you know how this medication affects you. Sit up or stand slowly to reduce the risk of dizzy or fainting spells. If you take migraine medications for 10 or more days a month, your migraines may get worse. Keep a diary of headache days and medication use. Contact your care team if your migraine attacks occur more frequently. What side effects may I notice from receiving this medication? Side effects that you should report to your care team as soon as possible:  Allergic reactions--skin rash, itching, hives, swelling of the face, lips, tongue, or throat Burning, pain, tingling, or color changes in the hands, arms, legs, or feet Heart attack--pain or tightness in the chest, shoulders, arms, or jaw, nausea, shortness of  breath, cold or clammy skin, feeling faint or lightheaded Heart rhythm changes--fast or irregular heartbeat, dizziness, feeling faint or lightheaded, chest pain, trouble breathing Increase in blood pressure Irritability, confusion, fast or irregular heartbeat, muscle stiffness, twitching muscles, sweating, high fever, seizure, chills, vomiting, diarrhea, which may be signs of serotonin syndrome Raynaud syndrome--cool, numb, or painful fingers or toes that may change color from pale, to blue, to red Seizures Stroke--sudden numbness or weakness of the face, arm, or leg, trouble speaking, confusion, trouble walking, loss of balance or coordination, dizziness, severe headache, change in vision Sudden or severe stomach pain, bloody diarrhea, fever, nausea, vomiting Vision loss Side effects that usually do not require medical attention (report to your care team if they continue or are bothersome): Dizziness Unusual weakness or fatigue This list may not describe all possible side effects. Call your doctor for medical advice about side effects. You may report side effects to FDA at 1-800-FDA-1088. Where should I keep my medication? Keep out of the reach of children and pets. Store at room temperature between 15 and 30 degrees C (59 and 86 degrees F). Protect from light and moisture. Get rid of any unused medication after the expiration date. To get rid of medications that are no longer needed or have expired: Take the medication to a medication take-back program. Check with your pharmacy or law enforcement to find a location. If you cannot return the medication, check the label or package insert to see if the medication should be thrown out in the garbage or flushed down the toilet. If you are not sure, ask your care team. If it is safe to put it in the trash, empty the medication out of the container. Mix the medication with cat litter, dirt, coffee grounds, or other unwanted substance. Seal the mixture  in a bag or container. Put it in the trash. NOTE: This sheet is a summary. It may not cover all possible information. If you have questions about this medicine, talk to your doctor, pharmacist, or health care provider.  2024 Elsevier/Gold Standard (2021-05-25 00:00:00)  Ondansetron Dissolving Tablets What is this medication? ONDANSETRON (on DAN se tron) prevents nausea and vomiting from chemotherapy, radiation, or surgery. It works by blocking substances in the body that may cause nausea or vomiting. It belongs to a group of medications called antiemetics. This medicine may be used for other purposes; ask your health care provider or pharmacist if you have questions. COMMON BRAND NAME(S): Zofran ODT What should I tell my care team before I take this medication? They need to know if you have any of these conditions: Heart disease Irregular heartbeat or rhythm Liver disease Low levels of magnesium or potassium in the blood An unusual or allergic reaction to ondansetron, other medications, foods, dyes, or preservatives Pregnant or trying to get pregnant Breastfeeding How should I use this medication? Take this medication by mouth. Take it as directed on the prescription label at the same time every day. You do not need water to take this medication. Leave the tablet in the sealed pack until you are ready to take it. With dry hands, open the pack and gently remove the tablet. Place the tablet in the mouth and allow it to dissolve. Then, swallow it. Talk to  your care team about the use of this medication in children. Special care may be needed. Overdosage: If you think you have taken too much of this medicine contact a poison control center or emergency room at once. NOTE: This medicine is only for you. Do not share this medicine with others. What if I miss a dose? If you miss a dose, take it as soon as you can. If it is almost time for your next dose, take only that dose. Do not take double or  extra doses. What may interact with this medication? Do not take this medication with any of the following: Apomorphine Certain medications for fungal infections, such as fluconazole, ketoconazole, posaconazole Cisapride Dronedarone Levoketoconazole Pimozide Quinidine Thioridazine This medication may also interact with the following: Certain medications for depression, anxiety, or other mental health conditions Certain medications for migraines, such as sumatriptan Linezolid Methylene blue Opioids Other medications that cause heart rhythm changes, such as dofetilide or ziprasidone St. John's wort Stimulant medications for ADHD, weight loss, or staying awake Tryptophan This list may not describe all possible interactions. Give your health care provider a list of all the medicines, herbs, non-prescription drugs, or dietary supplements you use. Also tell them if you smoke, drink alcohol, or use illegal drugs. Some items may interact with your medicine. What should I watch for while using this medication? Check with your care team as soon as you can if you have any sign of an allergic reaction. What side effects may I notice from receiving this medication? Side effects that you should report to your care team as soon as possible: Allergic reactions--skin rash, itching, hives, swelling of the face, lips, tongue, or throat Bowel blockage--stomach cramping, unable to have a bowel movement or pass gas, loss of appetite, vomiting Chest pain (angina)--pain, pressure, or tightness in the chest, neck, back, or arms Heart rhythm changes--fast or irregular heartbeat, dizziness, feeling faint or lightheaded, chest pain, trouble breathing Irritability, confusion, fast or irregular heartbeat, muscle stiffness, twitching muscles, sweating, high fever, seizure, chills, vomiting, diarrhea, which may be signs of serotonin syndrome Side effects that usually do not require medical attention (report to your  care team if they continue or are bothersome): Constipation Diarrhea General discomfort and fatigue Headache This list may not describe all possible side effects. Call your doctor for medical advice about side effects. You may report side effects to FDA at 1-800-FDA-1088. Where should I keep my medication? Keep out of the reach of children and pets. Store between 2 and 30 degrees C (36 and 86 degrees F). Throw away any unused medication after the expiration date. NOTE: This sheet is a summary. It may not cover all possible information. If you have questions about this medicine, talk to your doctor, pharmacist, or health care provider.  2024 Elsevier/Gold Standard (2022-03-28 00:00:00)

## 2022-08-24 LAB — TSH RFX ON ABNORMAL TO FREE T4: TSH: 1.99 u[IU]/mL (ref 0.450–4.500)

## 2022-08-24 LAB — COMPREHENSIVE METABOLIC PANEL
ALT: 17 IU/L (ref 0–32)
AST: 31 IU/L (ref 0–40)
Albumin: 4.4 g/dL (ref 3.9–4.9)
Alkaline Phosphatase: 81 IU/L (ref 44–121)
BUN/Creatinine Ratio: 18 (ref 9–23)
BUN: 16 mg/dL (ref 6–20)
Bilirubin Total: 0.3 mg/dL (ref 0.0–1.2)
CO2: 21 mmol/L (ref 20–29)
Calcium: 9.1 mg/dL (ref 8.7–10.2)
Chloride: 103 mmol/L (ref 96–106)
Creatinine, Ser: 0.9 mg/dL (ref 0.57–1.00)
Globulin, Total: 2.9 g/dL (ref 1.5–4.5)
Glucose: 94 mg/dL (ref 70–99)
Potassium: 4 mmol/L (ref 3.5–5.2)
Sodium: 139 mmol/L (ref 134–144)
Total Protein: 7.3 g/dL (ref 6.0–8.5)
eGFR: 88 mL/min/{1.73_m2} (ref >59)

## 2022-08-24 LAB — CBC WITH DIFFERENTIAL/PLATELET
Basophils Absolute: 0 10*3/uL (ref 0.0–0.2)
Basos: 0 %
EOS (ABSOLUTE): 0.1 10*3/uL (ref 0.0–0.4)
Eos: 2 %
Hematocrit: 37.1 % (ref 34.0–46.6)
Hemoglobin: 12.3 g/dL (ref 11.1–15.9)
Immature Grans (Abs): 0 10*3/uL (ref 0.0–0.1)
Immature Granulocytes: 0 %
Lymphocytes Absolute: 2.5 10*3/uL (ref 0.7–3.1)
Lymphs: 28 %
MCH: 27.7 pg (ref 26.6–33.0)
MCHC: 33.2 g/dL (ref 31.5–35.7)
MCV: 84 fL (ref 79–97)
Monocytes Absolute: 0.6 10*3/uL (ref 0.1–0.9)
Monocytes: 6 %
Neutrophils Absolute: 5.7 10*3/uL (ref 1.4–7.0)
Neutrophils: 64 %
Platelets: 216 10*3/uL (ref 150–450)
RBC: 4.44 x10E6/uL (ref 3.77–5.28)
RDW: 14.2 % (ref 11.7–15.4)
WBC: 8.9 10*3/uL (ref 3.4–10.8)

## 2022-08-24 LAB — HEMOGLOBIN A1C
Est. average glucose Bld gHb Est-mCnc: 126 mg/dL
Hgb A1c MFr Bld: 6 % — ABNORMAL HIGH (ref 4.8–5.6)

## 2022-08-26 ENCOUNTER — Encounter: Payer: Self-pay | Admitting: Neurology

## 2023-01-21 ENCOUNTER — Telehealth: Payer: Self-pay | Admitting: Neurology

## 2023-01-21 NOTE — Telephone Encounter (Signed)
Pt is asking for a statement  saying that no imaging was neccessary for her

## 2023-01-22 NOTE — Telephone Encounter (Signed)
I spoke with the patient.  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  She reports she has not filled any of the medication that Dr. Lucia Gaskins prescribed (looks like zofran and maxalt) and instead has cut out sugar, preservatives, and has not had a recurrent migraine since August.  She states there is no need for follow-up.

## 2023-01-22 NOTE — Telephone Encounter (Signed)
If she is still having symptoms and not improved or has new symptoms I would order an MRI brain at this time. Ask if she is amenable to that thanks

## 2023-01-22 NOTE — Telephone Encounter (Signed)
Pt calling in reference to letter needed. Transferred

## 2023-01-24 NOTE — Telephone Encounter (Signed)
Called pt and LVM (ok per DPR) advising that request for letter is pending for Dr Trevor Mace review and completion of letter. I advised letters/forms potentially could take up to 2 weeks. We will be in touch with her. She can call back if she has any other questions.

## 2023-01-24 NOTE — Telephone Encounter (Signed)
Pt checking status of letter needed. Advised we are awaiting the Dr's approval per last note. Requesting call back

## 2023-02-11 ENCOUNTER — Encounter: Payer: Self-pay | Admitting: Neurology

## 2023-02-11 ENCOUNTER — Encounter: Payer: Self-pay | Admitting: *Deleted

## 2023-02-11 NOTE — Telephone Encounter (Addendum)
 Please let patient know we will put this in a letter for her thanks  To whom it may concern: Mackenzie Walters is a patient in my neurology clinic.  I last saw her in July 2024 for episodic migraines.  Patient had an established migraine without aura diagnosis which was not intractable on their problem list.  She saw me for episodic migraines without any unusual features, she described migraine as unilateral, pulsating pounding, we discussed food triggers, photosensitivity, nausea vomiting which are all common symptoms of migraine disorder.  At the time she was having 4-5 migraines a month.  She did not report any: exertional or positional symptoms, sudden onset or thunderclap headache, new symptoms or changes in quality of her migraines, she also denied fever, chills, rash, weight loss or any other systemic symptoms, she also did not report any new neck stiffness, vision loss/new vision changes, or any other focal neurologic symptoms.  Her most blood work in July 2024 included CBC, CMP, thyroid which were all normal.  She had remote imaging CT of the head in which the brain showed no concerning findings.  Her neurologic exam was normal including speech, cognition, cranial nerves, coordination, gait, motor observation, tone, posture, strength, sensation, reflex exam. She had also been to an eye care specialist in April 2024 and examination showed refractive tests OD 20/20 OS 20/20 with pinhole aided visual acuity, extraocular movements intact, pupils equally round and reactive to light, extraocular movements full, slit-lamp examination was normal, funduscopic examination was normal, optic nerve was normal without swelling, assessment indicated good ocular health findings.  My exam also correlated with the eye examination findings above.  Given the normal neurologic exam and no red flags to indicate that new imaging was needed, an MRI of the brain was not ordered at that time.

## 2023-02-11 NOTE — Telephone Encounter (Signed)
 Letter printed, signed and placed up front for patient pickup. I spoke with pt. She will be around 12 pm today.

## 2024-03-11 ENCOUNTER — Ambulatory Visit: Payer: Self-pay | Admitting: Cardiology

## 2024-03-11 ENCOUNTER — Encounter: Payer: Self-pay | Admitting: Cardiology

## 2024-03-11 VITALS — BP 116/84 | HR 93 | Ht 67.0 in | Wt 169.4 lb

## 2024-03-11 DIAGNOSIS — Z7689 Persons encountering health services in other specified circumstances: Secondary | ICD-10-CM | POA: Insufficient documentation

## 2024-03-11 DIAGNOSIS — Z01818 Encounter for other preprocedural examination: Secondary | ICD-10-CM | POA: Insufficient documentation

## 2024-03-11 DIAGNOSIS — G43009 Migraine without aura, not intractable, without status migrainosus: Secondary | ICD-10-CM

## 2024-03-11 NOTE — Progress Notes (Signed)
 "  New Patient Office Visit  Subjective   Patient ID: Mackenzie Walters, female    DOB: [birth year]  Age: 34 y.o. MRN: [id number]  CC:  Chief Complaint  Patient presents with   Establish Care    NPE, needs clearance for surgery    HPI Mackenzie Walters presents to establish care Previous Primary Care provider/office:   she does have additional concerns to discuss today.   Patient in office to establish care. Patient doing well, no complaints today. Patient does not take any medications.  Patient is scheduled for cosmetic surgery, shaving of her jaw, on 04/03/24, needing clearance to proceed.  Patient has a past medical history of migraines. Patient reports making changes to her diet, exercising and losing weight. Migraines are managed by lifestyle changes.  Patient participates in the [employer] with no problems.  There is no indication patient should not proceed with surgery.     Outpatient Encounter Medications as of 03/11/2024  Medication Sig   [DISCONTINUED] ondansetron  (ZOFRAN -ODT) 4 MG disintegrating tablet Take 1-2 tablets (4-8 mg total) by mouth every 8 (eight) hours as needed. (Patient not taking: Reported on 03/11/2024)   [DISCONTINUED] rizatriptan  (MAXALT -MLT) 10 MG disintegrating tablet Take 1 tablet (10 mg total) by mouth as needed for migraine. May repeat in 2 hours if needed (Patient not taking: Reported on 03/11/2024)   No facility-administered encounter medications on file as of 03/11/2024.    Past Medical History:  Diagnosis Date   Asthma    as child    Past Surgical History:  Procedure Laterality Date   MOUTH SURGERY  2009    Family History  Problem Relation Age of Onset   Cancer Maternal Grandmother    Heart disease Paternal Grandmother    Heart disease Paternal Grandfather     Social History   Socioeconomic History   Marital status: Married    Spouse name: Not on file   Number of children: Not on file   Years of education: Not on file   Highest  education level: Not on file  Occupational History   Not on file  Tobacco Use   Smoking status: Never   Smokeless tobacco: Never  Substance and Sexual Activity   Alcohol use: No   Drug use: No   Sexual activity: Yes    Comment: desires tubal  Other Topics Concern   Not on file  Social History Narrative   Not on file   Social Drivers of Health   Tobacco Use: Low Risk (03/11/2024)   Patient History    Smoking Tobacco Use: Never    Smokeless Tobacco Use: Never    Passive Exposure: Not on file  Financial Resource Strain: Not on file  Food Insecurity: Not on file  Transportation Needs: Not on file  Physical Activity: Not on file  Stress: Not on file  Social Connections: Not on file  Intimate Partner Violence: Not on file  Depression (EYV7-0): Low Risk (03/11/2024)   Depression (PHQ2-9)    PHQ-2 Score: 1  Alcohol Screen: Not on file  Housing: Not on file  Utilities: Not on file  Health Literacy: Not on file    Review of Systems  Constitutional: Negative.   HENT: Negative.    Eyes: Negative.   Respiratory: Negative.  Negative for shortness of breath.   Cardiovascular: Negative.  Negative for chest pain.  Gastrointestinal: Negative.  Negative for abdominal pain, constipation and diarrhea.  Genitourinary: Negative.   Musculoskeletal:  Negative for joint pain and myalgias.  Skin: Negative.   Neurological: Negative.  Negative for dizziness and headaches.  Endo/Heme/Allergies: Negative.   All other systems reviewed and are negative.       Objective   BP 116/84   Pulse 93   Ht 5' 7 (1.702 m)   Wt 169 lb 6.4 oz (76.8 kg)   SpO2 97%   BMI 26.53 kg/m   Physical Exam Vitals and nursing note reviewed.  Constitutional:      Appearance: Normal appearance. She is normal weight.  HENT:     Head: Normocephalic and atraumatic.     Nose: Nose normal.     Mouth/Throat:     Mouth: Mucous membranes are moist.  Eyes:     Extraocular Movements: Extraocular movements  intact.     Conjunctiva/sclera: Conjunctivae normal.     Pupils: Pupils are equal, round, and reactive to light.  Cardiovascular:     Rate and Rhythm: Normal rate and regular rhythm.     Pulses: Normal pulses.     Heart sounds: Normal heart sounds.  Pulmonary:     Effort: Pulmonary effort is normal.     Breath sounds: Normal breath sounds.  Abdominal:     General: Abdomen is flat. Bowel sounds are normal.     Palpations: Abdomen is soft.  Musculoskeletal:        General: Normal range of motion.     Cervical back: Normal range of motion.  Skin:    General: Skin is warm and dry.  Neurological:     General: No focal deficit present.     Mental Status: She is alert and oriented to person, place, and time.  Psychiatric:        Mood and Affect: Mood normal.        Behavior: Behavior normal.        Thought Content: Thought content normal.        Judgment: Judgment normal.       03/11/2024    9:41 AM  GAD 7 : Generalized Anxiety Score  Nervous, Anxious, on Edge 0  Control/stop worrying 0  Worry too much - different things 0  Trouble relaxing 0  Restless 0  Easily annoyed or irritable 0  Afraid - awful might happen 0  Total GAD 7 Score 0  Anxiety Difficulty Not difficult at all    Flowsheet Row Office Visit from 03/11/2024 in [facility]  Thoughts that you would be better off dead, or of hurting yourself in some way Not at all  PHQ-9 Total Score 1       Assessment & Plan:  Patient may proceed with surgery.  Problem List Items Addressed This Visit       Cardiovascular and Mediastinum   Migraine without aura and without status migrainosus, not intractable     Other   Encounter to establish care - Primary   Pre-operative exam    Return in about 1 year (around 03/11/2025).   Total time spent: 25 minutes. This time includes review of previous notes and results and patient face to face interaction during today's visit.    Jeoffrey Pollen,  NP  03/11/2024   This document may have been prepared by [facility] Voice Recognition software and as such may include unintentional dictation errors.  "
